# Patient Record
Sex: Female | Born: 1960 | Race: White | Hispanic: No | Marital: Married | Smoking: Never smoker
Health system: Southern US, Community
[De-identification: ages and names within clinical notes are randomized; demographics above are authoritative.]

## PROBLEM LIST (undated history)

## (undated) DIAGNOSIS — H9319 Tinnitus, unspecified ear: Secondary | ICD-10-CM

## (undated) DIAGNOSIS — E039 Hypothyroidism, unspecified: Secondary | ICD-10-CM

## (undated) DIAGNOSIS — I639 Cerebral infarction, unspecified: Secondary | ICD-10-CM

## (undated) DIAGNOSIS — R42 Dizziness and giddiness: Secondary | ICD-10-CM

## (undated) HISTORY — DX: Tinnitus, unspecified ear: H93.19

## (undated) HISTORY — DX: Cerebral infarction, unspecified: I63.9

## (undated) HISTORY — PX: LUMBAR FUSION: SHX111

## (undated) HISTORY — DX: Hypothyroidism, unspecified: E03.9

## (undated) HISTORY — DX: Dizziness and giddiness: R42

---

## 2005-02-12 HISTORY — PX: LASIK: SHX215

## 2014-11-29 DIAGNOSIS — I639 Cerebral infarction, unspecified: Secondary | ICD-10-CM

## 2014-11-29 HISTORY — DX: Cerebral infarction, unspecified: I63.9

## 2014-12-09 ENCOUNTER — Ambulatory Visit (INDEPENDENT_AMBULATORY_CARE_PROVIDER_SITE_OTHER): Payer: BLUE CROSS/BLUE SHIELD | Admitting: Diagnostic Neuroimaging

## 2014-12-09 ENCOUNTER — Encounter: Payer: Self-pay | Admitting: *Deleted

## 2014-12-09 VITALS — BP 128/85 | HR 57 | Ht 62.0 in | Wt 164.2 lb

## 2014-12-09 DIAGNOSIS — I639 Cerebral infarction, unspecified: Secondary | ICD-10-CM

## 2014-12-09 DIAGNOSIS — I6381 Other cerebral infarction due to occlusion or stenosis of small artery: Secondary | ICD-10-CM

## 2014-12-09 NOTE — Progress Notes (Signed)
[facility]  PATIENT: Janice Warren DOB: 1960/04/25  REFERRING CLINICIAN: Vyas  HISTORY FROM: patient and husband  REASON FOR VISIT: new consult     HISTORICAL  CHIEF COMPLAINT:  No chief complaint on file.   HISTORY OF PRESENT ILLNESS:   54 year old right-handed female with hypothyroidism, here for evaluation of stroke.  11/29/14 patient was under increased stress, moving boxes and furniture, when all of a sudden she had dizziness, difficulty standing, right arm and right leg heaviness, mild word finding difficulties. This occurred around 10:00 PM. Patient was evaluated by her son that evening and patient and son decided not to go to the emergency room that evening.  The next day patient's symptoms were persistent. She went to her PCP for evaluation of vertigo but right sided weakness symptoms were not explained. One week later patient had follow-up and at that time right-sided weakness was noted. Patient was then scheduled for MRI scan which was completed on 12/07/14. Acute to subacute stroke was noted in the left posterior frontal centrum semiovale region. Patient also had carotid ultrasound testing showed no significant stenosis of bilateral ICAs.  Since that time symptoms have slightly improved. She still has some weakness on her right side, mainly in her right leg. She denies any weakness in her right face. Her language problems have resolved. She denies any numbness in her right side.   REVIEW OF SYSTEMS: Full 14 system review of systems performed and notable only for headache weakness dizziness joint pain allergies decreased energy.  ALLERGIES: No Known Allergies  HOME MEDICATIONS: No outpatient prescriptions prior to visit.   No facility-administered medications prior to visit.    PAST MEDICAL HISTORY: Past Medical History  Diagnosis Date  . Hypothyroid     x 18 years  . Stroke [facility]) 11/29/14    PAST SURGICAL HISTORY: Past Surgical  History  Procedure Laterality Date  . Cesarean section  1994    FAMILY HISTORY: Family History  Problem Relation Age of Onset  . Osteoporosis Mother   . Dementia Father   . Stroke Father   . Heart attack Father   . Hypertension Sister   . Coronary artery disease Sister     SOCIAL HISTORY:  Social History   Social History  . Marital Status: Married    Spouse Name: Loraine Leriche  . Number of Children: 3  . 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 Smoking status: Never Smoker   . Smokeless tobacco: Not on file  . Alcohol Use: No  . Drug Use: No  . Sexual Activity: Not on file   Other Topics Concern  . Not on file   Social History Narrative   Lives at home with husband   Caffeine use- none     PHYSICAL EXAM  GENERAL EXAM/CONSTITUTIONAL: Vitals:  Filed Vitals:   12/09/14 1422  BP: 128/85  Pulse: 57  Height:  (1.575 m)  Weight: 164 lb 3.2 oz (74.481 kg)     Body mass index is 30.03 kg/(m^2).  Visual Acuity Screening   Right eye Left eye Both eyes  Without correction:     With correction: 20/30 20/40      Patient is in no distress; well developed, nourished and groomed; neck is supple  CARDIOVASCULAR:  Examination of carotid arteries is normal; no carotid bruits  Regular rate and rhythm, no murmurs  Examination of peripheral  vascular system by observation and palpation is normal  EYES:  Ophthalmoscopic exam of optic discs and posterior segments is normal; no papilledema or hemorrhages  MUSCULOSKELETAL:  Gait, strength, tone, movements noted in Neurologic exam below  NEUROLOGIC: MENTAL STATUS:  No flowsheet data found.  awake, alert, oriented to person, place and time  recent and remote memory intact  normal attention and concentration  language fluent, comprehension intact, naming intact,   fund of knowledge appropriate  CRANIAL NERVE:   2nd - no papilledema on fundoscopic  exam  2nd, 3rd, 4th, 6th - pupils equal and reactive to light, visual fields full to confrontation, extraocular muscles intact, no nystagmus  5th - facial sensation symmetric  7th - facial strength symmetric  8th - hearing intact  9th - palate elevates symmetrically, uvula midline  11th - shoulder shrug symmetric  12th - tongue protrusion midline  MOTOR:   normal bulk and tone, full strength in the LUE, LLE; RIGHT TRICEP 4+, RIGHT HIP FLEX 3+; OTHERWISE RLE 5  SLIGHTLY DECR FINGER AND FOOT TAPPING ON RIGHT SIDE   SENSORY:   normal and symmetric to light touch, temperature, vibration  COORDINATION:   finger-nose-finger, fine finger movements SLIGHTLY SLOWER ON RIGHT SIDE  REFLEXES:   deep tendon reflexes present and symmetric  GAIT/STATION:   narrow based gait; SLIGHT LIMP ON RIGHT LEG    DIAGNOSTIC DATA (LABS, IMAGING, TESTING) - I reviewed patient records, labs, notes, testing and imaging myself where available.  No results found for: WBC, HGB, HCT, MCV, PLT No results found for: NA, K, CL, CO2, GLUCOSE, BUN, CREATININE, CALCIUM, PROT, ALBUMIN, AST, ALT, ALKPHOS, BILITOT, GFRNONAA, GFRAA No results found for: CHOL, HDL, LDLCALC, LDLDIRECT, TRIG, CHOLHDL No results found for: XBMW4XHGBA1C No results found for: VITAMINB12 No results found for: TSH   12/07/14 MRI BRAIN [I reviewed images myself and agree with interpretation. ADC map signal is iso-intense, with T2 and T2FLAIR hyperintensity, consistent with -VRP]  - Acute LEFT posterior frontoparietal subcortical white matter infarct. No associated hemorrhage.  12/08/14 Carotid u/s  - Color duplex indicates minimal heterogeneous plaque, with no hemodynamically significant stenosis by duplex criteria in the extracranial cerebrovascular circulation.      ASSESSMENT AND PLAN  54 y.o. 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  Patient was not on antiplatelet therapy at the time of stroke. Now patient is on Plavix 75 mg daily. Interestingly patient does not seem to have any obvious stroke risk factors at this time. Last PCP if diabetes and lipid panel were checked recently. Patient also needs echocardiogram of the heart. Due to patient's home location and distance from [place], patient and husband prefer that these testing be arranged by PCP local to her home.   Dx:  Left sided lacunar stroke [facility](HCC)  PLAN: - continue plavix 75mg  daily - ask Dr. Sherril CroonVyas to setup ultrasound of heart (echocardiogram), sleep study, physical/occupational therapy, cholesterol panel testing, diabetes testing; patient prefers to have testing done locally near [place], and requests that these be arranged by Dr. Sherril CroonVyas. - stroke warning signs and treatment reviewed  Return in about 3 months (around 03/11/2015).    Suanne MarkerVIKRAM R. Soleia Badolato, MD 12/09/2014, 3:12 PM Certified in Neurology, Neurophysiology and Neuroimaging  [facility] [address], [address] [place], [place] [address] [phone]

## 2014-12-09 NOTE — Patient Instructions (Signed)
Thank you for coming to see [place] at [facility]. I hope we have been able to provide you high quality care today.  You may receive a patient satisfaction survey over the next few weeks. We would appreciate your feedback and comments so that we may continue to improve ourselves and the health of our patients.  - continue plavix 75m daily - ask Dr. VWoody Sellerto setup ultrasound of heart (echocardiogram), sleep study, physical/occupational therapy, cholesterol panel testing, diabetes testing   ~~~~~~~~~~~~~~~~~~~~~~~~~~~~~~~~~~~~~~~~~~~~~~~~~~~~~~~~~~~~~~~~~  DR. Leeanne Butters'S GUIDE TO HAPPY AND HEALTHY LIVING These are some of my general health and wellness recommendations. Some of them may apply to you better than others. Please use common sense as you try these suggestions and feel free to ask me any questions.   ACTIVITY/FITNESS Mental, social, emotional and physical stimulation are very important for brain and body health. Try learning a new activity (arts, music, language, sports, games).  Keep moving your body to the best of your abilities. You can do this at home, inside or outside, the park, community center, gym or anywhere you like. Consider a physical therapist or personal trainer to get started. Consider the app Sworkit. Fitness trackers such as smart-watches, smart-phones or Fitbits can help as well.   NUTRITION Eat more plants: colorful vegetables, nuts, seeds and berries.  Eat less sugar, salt, preservatives and processed foods.  Avoid toxins such as cigarettes and alcohol.  Drink water when you are thirsty. Warm water with a slice of lemon is an excellent morning drink to start the day.  Consider these websites for more information The Nutrition Source (hhttps://www.henry-hernandez.biz/ Precision Nutrition (wWindowBlog.ch   RELAXATION Consider practicing mindfulness meditation or other relaxation techniques such as  deep breathing, prayer, yoga, tai chi, massage. See website mindful.org or the apps Headspace or Calm to help get started.   SLEEP Try to get at least 7-8+ hours sleep per day. Regular exercise and reduced caffeine will help you sleep better. Practice good sleep hygeine techniques. See website sleep.org for more information.   PLANNING Prepare estate planning, living will, healthcare POA documents. Sometimes this is best planned with the help of an attorney. Theconversationproject.org and agingwithdignity.org are excellent resources.

## 2014-12-13 ENCOUNTER — Telehealth: Payer: Self-pay | Admitting: Diagnostic Neuroimaging

## 2014-12-13 NOTE — Telephone Encounter (Signed)
Spoke with Elon JesterMichele, Dr Sherril CroonVyas office and informed her of patient wanting tests, services scheduled through Dr Sherril CroonVyas office due to where she lives. Elon JesterMichele stated she does not have information which this office sent on 12/09/14. Faxed Dr Richrd HumblesPenumalli's OV note from 12/09/14 to Michele/Dr Vyas.  1:57 pm  Spoke with patient and informed her that information was faxed attention Elon JesterMichele, Dr Sherril CroonVyas office. She verbalized understanding, appreciation for call.

## 2014-12-13 NOTE — Telephone Encounter (Signed)
Pt called and states that she called her PCP and they have not been notified of setting up testing for her per office visit with Dr. Marjory LiesPenumalli. She would like a call to be made to Dr. Bonnita LevanVyas's office as soon as possible so appts can be made. Please call and advise [phone]

## 2015-01-19 ENCOUNTER — Telehealth: Payer: Self-pay | Admitting: Diagnostic Neuroimaging

## 2015-01-19 NOTE — Telephone Encounter (Signed)
Pt called and needs a letter of proof of neurological damage. She requested imaging copies and was told how to get them. She also requested office notes. I told her she would need to go through medical records. She expressed understanding. Please call and advise about letter [phone]

## 2015-01-19 NOTE — Telephone Encounter (Signed)
Spoke with patient who states she has secondary insurance that is requiring documents, imaging, etc. Informed her that Dr Visteon CorporationPenumalli's office visit note, MRI reports ang imaging should be sufficient documentation for her; a letter would most likely just be repetition of office note. She sated that she understood, agreed. Advised she gather all required information and if insurance needs additional, they will contact her. At that point she can call this office back. She verbalized understanding, appreciation for call.

## 2015-02-01 ENCOUNTER — Telehealth: Payer: Self-pay | Admitting: Diagnostic Neuroimaging

## 2015-02-01 NOTE — Telephone Encounter (Signed)
LVM informing patient of Dr Richrd HumblesPenumalli's reply. Advised that if she wants to see him before 03/15/15 follow up, the phone staff can make that appointment for her. Left this caller's name, number.

## 2015-02-01 NOTE — Telephone Encounter (Signed)
Patient called regarding headaches this month, patient has history of stroke, diagnosed with mild sleep apnea approximately 2 weeks ago, exhausted, gets dizzy every now and then, sensitive to light sometimes, doesn't feel that these are stroke symptoms.

## 2015-02-01 NOTE — Telephone Encounter (Signed)
If headaches are new, we can check another mri brain. Or she can come in for evaluation here, PCP or ER. -VRP

## 2015-02-01 NOTE — Telephone Encounter (Signed)
Spoke with patient who states she has been waking up every day x 2-3 weeks with headache "across her forehead". She takes regular Tylenol prn with relief, but she states "HA comes back when medication wears off". She states she is waiting for CPAP, still fatigued, has occasional dizziness but no falls, denies blurred vision, numbness, tingling, speech difficulties or other stroke like symptoms. She states "my thyroid level is still not right either". She states she only drinks water and tries to drink 8 glasses/day to stay hydrated. She states her husband watches TV and has the volume loud, which she states bothers her but this has been ongoing. She states the light sensitivity and dizziness do not necessarily occur during her headaches. Advised her that her symptoms are most likely related to sleep apnea and thyroid issues, however will route her concerns to Dr Marjory LiesPenumalli for his response. Informed her would try to call her back by 5 pm today, but no later than 5 pm tomorrow.  She verbalized understanding, appreciation.

## 2015-03-15 ENCOUNTER — Encounter: Payer: Self-pay | Admitting: Diagnostic Neuroimaging

## 2015-03-15 ENCOUNTER — Ambulatory Visit (INDEPENDENT_AMBULATORY_CARE_PROVIDER_SITE_OTHER): Payer: BLUE CROSS/BLUE SHIELD | Admitting: Diagnostic Neuroimaging

## 2015-03-15 VITALS — BP 121/80 | HR 64 | Ht 62.0 in | Wt 173.2 lb

## 2015-03-15 DIAGNOSIS — I6381 Other cerebral infarction due to occlusion or stenosis of small artery: Secondary | ICD-10-CM

## 2015-03-15 DIAGNOSIS — I639 Cerebral infarction, unspecified: Secondary | ICD-10-CM

## 2015-03-15 NOTE — Progress Notes (Signed)
[facility]  PATIENT: Janice Warren DOB: 1960/04/08  REFERRING CLINICIAN: Vyas  HISTORY FROM: patient and sister  REASON FOR VISIT: follow up    HISTORICAL  CHIEF COMPLAINT:  Chief Complaint  Patient presents with  . Left sided lacunar stroke    rm 7, sister Victorino Dike, Sleep apnea- CPAP ordered, doing PT exercises at home, regainging strength but not as active as I used to be"  . Follow-up    3 month    HISTORY OF PRESENT ILLNESS:   UPDATE 03/15/15: Since last visit, doing well. Testing completed. Echo, labs unremarkable. Sleep apnea found on PSG, now planning to have CPAP treatment. Pt sister here today, and now reports strong family hx of afib. Headaches from 1 month ago are resolved with increaed water intake.  PRIOR HPI (12/09/14): 55 year old right-handed female with hypothyroidism, here for evaluation of stroke. 11/29/14 patient was under increased stress, moving boxes and furniture, when all of a sudden she had dizziness, difficulty standing, right arm and right leg heaviness, mild word finding difficulties. This occurred around 10:00 PM. Patient was evaluated by her son that evening and patient and son decided not to go to the emergency room that evening. The next day patient's symptoms were persistent. She went to her PCP for evaluation of vertigo but right sided weakness symptoms were not explained. One week later patient had follow-up and at that time right-sided weakness was noted. Patient was then scheduled for MRI scan which was completed on 12/07/14. Acute to subacute stroke was noted in the left posterior frontal centrum semiovale region. Patient also had carotid ultrasound testing showed no significant stenosis of bilateral ICAs. Since that time symptoms have slightly improved. She still has some weakness on her right side, mainly in her right leg. She denies any weakness in her right face. Her language problems have resolved. She denies any  numbness in her right side.   REVIEW OF SYSTEMS: Full 14 system review of systems performed and notable only for decr concentration light sens weakness.  ALLERGIES: No Known Allergies  HOME MEDICATIONS: Outpatient Prescriptions Prior to Visit  Medication Sig Dispense Refill  . acetaminophen (TYLENOL) 325 MG tablet Take 650 mg by mouth every 6 (six) hours as needed.    . clopidogrel (PLAVIX) 75 MG tablet 75 mg daily.    Marland Kitchen escitalopram (LEXAPRO) 10 MG tablet 10 mg daily.    Marland Kitchen levothyroxine (SYNTHROID, LEVOTHROID) 88 MCG tablet Take 88 mcg by mouth daily before breakfast.    . meclizine (ANTIVERT) 25 MG tablet 25 mg.  0  . Vitamin D, Ergocalciferol, (DRISDOL) 50000 UNITS CAPS capsule Take 50,000 Units by mouth once a week.  5  . zolpidem (AMBIEN) 10 MG tablet Take 10 mg by mouth at bedtime as needed for sleep. USUALLY TAKES 1/2 TAB    . diclofenac (VOLTAREN) 75 MG EC tablet 75 mg.  2   No facility-administered medications prior to visit.    PAST MEDICAL HISTORY: Past Medical History  Diagnosis Date  . Hypothyroid     x 18 years  . Stroke [facility]) 11/29/14    PAST SURGICAL HISTORY: Past Surgical History  Procedure Laterality Date  . Cesarean section  1994    FAMILY HISTORY: Family History  Problem Relation Age of Onset  . Osteoporosis Mother   . Dementia Father   . Stroke Father   . Heart attack Father   . Hypertension Sister   . Coronary artery disease Sister  SOCIAL HISTORY:  Social History   Social History  . Marital Status: Married    Spouse Name: Loraine Leriche  . Number of Children: 3  . 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 Smoking status: Never Smoker   . Smokeless tobacco: Not on file  . Alcohol Use: No  . Drug Use: No  . Sexual Activity: Not on file   Other Topics Concern  . Not on file   Social History Narrative   Lives at home with husband   Caffeine use- none     PHYSICAL  EXAM  GENERAL EXAM/CONSTITUTIONAL: Vitals:  Filed Vitals:   03/15/15 1037  BP: 121/80  Pulse: 64  Height:  (1.575 m)  Weight: 173 lb 3.2 oz (78.563 kg)   Body mass index is 31.67 kg/(m^2). No exam data present  Patient is in no distress; well developed, nourished and groomed; neck is supple  CARDIOVASCULAR:  Examination of carotid arteries is normal; no carotid bruits  Regular rate and rhythm, no murmurs  Examination of peripheral vascular system by observation and palpation is normal  EYES:  Ophthalmoscopic exam of optic discs and posterior segments is normal; no papilledema or hemorrhages  MUSCULOSKELETAL:  Gait, strength, tone, movements noted in Neurologic exam below  NEUROLOGIC: MENTAL STATUS:  No flowsheet data found.  awake, alert, oriented to person, place and time  recent and remote memory intact  normal attention and concentration  language fluent, comprehension intact, naming intact,   fund of knowledge appropriate  CRANIAL NERVE:   2nd, 3rd, 4th, 6th - pupils equal and reactive to light, visual fields full to confrontation, extraocular muscles intact, no nystagmus  5th - facial sensation symmetric  7th - facial strength symmetric  8th - hearing intact  9th - palate elevates symmetrically, uvula midline  11th - shoulder shrug symmetric  12th - tongue protrusion midline  MOTOR:   normal bulk and tone, full strength in the BUE and BLE  SENSORY:   normal and symmetric to light touch, temperature, vibration  COORDINATION:   finger-nose-finger, fine finger movements normal  REFLEXES:   deep tendon reflexes present and symmetric  GAIT/STATION:   narrow based gait; able to tandem    DIAGNOSTIC DATA (LABS, IMAGING, TESTING) - I reviewed patient records, labs, notes, testing and imaging myself where available.  No results found for: WBC, HGB, HCT, MCV, PLT No results found for: NA, K, CL, CO2, GLUCOSE, BUN, CREATININE,  CALCIUM, PROT, ALBUMIN, AST, ALT, ALKPHOS, BILITOT, GFRNONAA, GFRAA No results found for: CHOL, HDL, LDLCALC, LDLDIRECT, TRIG, CHOLHDL No results found for: [id number] No results found for: VITAMINB12 No results found for: TSH   12/07/14 MRI BRAIN [I reviewed images myself and agree with interpretation. ADC map signal is iso-intense, with T2 and T2FLAIR hyperintensity, consistent with -VRP]  - Acute LEFT posterior frontoparietal subcortical white matter infarct. No associated hemorrhage.  12/08/14 Carotid u/s  - Color duplex indicates minimal heterogeneous plaque, with no hemodynamically significant stenosis by duplex criteria in the extracranial cerebrovascular circulation.      ASSESSMENT AND PLAN  54 y.o. 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 Patient was not on antiplatelet therapy at the time of stroke. Now patient is on Plavix 75 mg daily. Has had good recovery. Stroke risk factors at this time include sleep apnea.   Dx:  Left sided lacunar stroke (HCC)   PLAN: - continue plavix  daily - continue sleep apnea treatment plan - stroke warning signs and treatment reviewed - refer to cardiology (Dr. Catha Gosselin) for outpatient cardiac monitoring due to strong afib family history  Return if symptoms worsen or fail to improve, for return to PCP.    Suanne Marker, MD 03/15/2015, 11:06 AM Certified in Neurology, Neurophysiology and Neuroimaging  [facility] [address] [place], [place] [address] [phone]

## 2015-03-15 NOTE — Patient Instructions (Signed)
Thank you for coming to see [place] at [facility]. I hope we have been able to provide you high quality care today.  You may receive a patient satisfaction survey over the next few weeks. We would appreciate your feedback and comments so that we may continue to improve ourselves and the health of our patients.  - follow up with PCP (Dr. Woody Seller)  - I will refer you to cardiology (Dr. Luiz Ochoa) for heart monitor testing   ~~~~~~~~~~~~~~~~~~~~~~~~~~~~~~~~~~~~~~~~~~~~~~~~~~~~~~~~~~~~~~~~~  DR. Kashmir Leedy'S GUIDE TO HAPPY AND HEALTHY LIVING These are some of my general health and wellness recommendations. Some of them may apply to you better than others. Please use common sense as you try these suggestions and feel free to ask me any questions.   ACTIVITY/FITNESS Mental, social, emotional and physical stimulation are very important for brain and body health. Try learning a new activity (arts, music, language, sports, games).  Keep moving your body to the best of your abilities. You can do this at home, inside or outside, the park, community center, gym or anywhere you like. Consider a physical therapist or personal trainer to get started. Consider the app Sworkit. Fitness trackers such as smart-watches, smart-phones or Fitbits can help as well.   NUTRITION Eat more plants: colorful vegetables, nuts, seeds and berries.  Eat less sugar, salt, preservatives and processed foods.  Avoid toxins such as cigarettes and alcohol.  Drink water when you are thirsty. Warm water with a slice of lemon is an excellent morning drink to start the day.  Consider these websites for more information The Nutrition Source (https://www.henry-hernandez.biz/) Precision Nutrition (WindowBlog.ch)   RELAXATION Consider practicing mindfulness meditation or other relaxation techniques such as deep breathing, prayer, yoga, tai chi, massage. See website mindful.org  or the apps Headspace or Calm to help get started.   SLEEP Try to get at least 7-8+ hours sleep per day. Regular exercise and reduced caffeine will help you sleep better. Practice good sleep hygeine techniques. See website sleep.org for more information.   PLANNING Prepare estate planning, living will, healthcare POA documents. Sometimes this is best planned with the help of an attorney. Theconversationproject.org and agingwithdignity.org are excellent resources.

## 2018-12-03 ENCOUNTER — Other Ambulatory Visit: Payer: Self-pay

## 2018-12-03 ENCOUNTER — Encounter (HOSPITAL_COMMUNITY): Payer: Self-pay | Admitting: *Deleted

## 2018-12-03 ENCOUNTER — Emergency Department (HOSPITAL_COMMUNITY)
Admission: EM | Admit: 2018-12-03 | Discharge: 2018-12-03 | Disposition: A | Discharge status: Home / Self Care | Payer: BC Managed Care – PPO | Attending: Emergency Medicine | Admitting: Emergency Medicine

## 2018-12-03 ENCOUNTER — Emergency Department (HOSPITAL_COMMUNITY): Payer: BC Managed Care – PPO

## 2018-12-03 DIAGNOSIS — Z79899 Other long term (current) drug therapy: Secondary | ICD-10-CM | POA: Diagnosis not present

## 2018-12-03 DIAGNOSIS — R0602 Shortness of breath: Secondary | ICD-10-CM

## 2018-12-03 DIAGNOSIS — J9801 Acute bronchospasm: Secondary | ICD-10-CM

## 2018-12-03 DIAGNOSIS — R0981 Nasal congestion: Secondary | ICD-10-CM | POA: Diagnosis present

## 2018-12-03 DIAGNOSIS — Z7902 Long term (current) use of antithrombotics/antiplatelets: Secondary | ICD-10-CM | POA: Insufficient documentation

## 2018-12-03 DIAGNOSIS — E039 Hypothyroidism, unspecified: Secondary | ICD-10-CM | POA: Insufficient documentation

## 2018-12-03 MED ORDER — ALBUTEROL SULFATE HFA 108 (90 BASE) MCG/ACT IN AERS
2.0000 | INHALATION_SPRAY | Freq: Once | RESPIRATORY_TRACT | Status: AC
  Administered 2018-12-03: 2 via RESPIRATORY_TRACT
  Filled 2018-12-03: qty 6.7

## 2018-12-03 MED ORDER — PREDNISONE 50 MG PO TABS
60.0000 mg | ORAL_TABLET | Freq: Once | ORAL | Status: AC
  Administered 2018-12-03: 60 mg via ORAL
  Filled 2018-12-03: qty 1

## 2018-12-03 MED ORDER — PREDNISONE 20 MG PO TABS: ORAL_TABLET | ORAL | 0 refills | Status: DC

## 2018-12-03 NOTE — ED Provider Notes (Signed)
[facility] EMERGENCY DEPARTMENT Provider Note   CSN: [id number] Arrival date & time: 12/03/18  1414     History   Chief Complaint No chief complaint on file.   HPI Janice Warren is a 58 y.o. female.     HPI Patient states she has had sinus congestion and ear infection for the past month.  She has had 2 rounds of antibiotics including Augmentin and doxycycline.  She occasionally has vertigo for which she takes Antivert.  She presents today for sensation she is " exhaling through cotton".  States she has a dry nonproductive cough.  She denies any chest pain.  Denies any fever or chills.  No new lower extremity swelling or pain.  Was seen by her primary physician referred to the emergency department for a chest x-ray to rule out pneumonia. Past Medical History:  Diagnosis Date  . Hypothyroid    x 18 years  . Stroke ([place]) 11/29/14    There are no active problems to display for this patient.   Past Surgical History:  Procedure Laterality Date  . CESAREAN SECTION  1994     OB History   No obstetric history on file.      Home Medications    Prior to Admission medications   Medication Sig Start Date End Date Taking? Authorizing Provider  acetaminophen (TYLENOL) 325 MG tablet Take 650 mg by mouth every 6 (six) hours as needed.    [provider]  clopidogrel (PLAVIX) 75 MG tablet 75 mg daily. 12/08/14   [provider]  escitalopram (LEXAPRO) 10 MG tablet 10 mg daily. 09/30/14   [provider]  levothyroxine (SYNTHROID, LEVOTHROID) 88 MCG tablet Take 88 mcg by mouth daily before breakfast.    [provider]  meclizine (ANTIVERT) 25 MG tablet 25 mg. 11/30/14   [provider]  predniSONE (DELTASONE) 20 MG tablet 3 tabs po day one, then 2 po daily x 4 days 12/04/18   Julianne Rice, MD  Vitamin D, Ergocalciferol, (DRISDOL) 50000 UNITS CAPS capsule Take 50,000 Units by mouth once a week. 11/29/14   [provider]   zolpidem (AMBIEN) 10 MG tablet Take 10 mg by mouth at bedtime as needed for sleep. USUALLY TAKES 1/2 TAB    [provider]    Family History Family History  Problem Relation Age of Onset  . Osteoporosis Mother   . Dementia Father   . Stroke Father   . Heart attack Father   . Hypertension Sister   . Coronary artery disease Sister     Social History Social History   Tobacco Use  . Smoking status: Never Smoker  . Smokeless tobacco: Never Used  Substance Use Topics  . Alcohol use: No    Alcohol/week: 0.0 standard drinks  . Drug use: No     Allergies   Patient has no known allergies.   Review of Systems Review of Systems  Constitutional: Negative for chills and fever.  HENT: Positive for congestion and rhinorrhea. Negative for sinus pressure and sore throat.   Eyes: Negative for pain, redness and visual disturbance.  Respiratory: Positive for cough.   Cardiovascular: Negative for chest pain, palpitations and leg swelling.  Gastrointestinal: Negative for abdominal pain, constipation, diarrhea, nausea and vomiting.  Genitourinary: Negative for dysuria, flank pain and frequency.  Musculoskeletal: Negative for back pain, myalgias, neck pain and neck stiffness.  Skin: Negative for rash and wound.  Neurological: Positive for dizziness. Negative for syncope, weakness, light-headedness, numbness  and headaches.  All other systems reviewed and are negative.    Physical Exam Updated Vital Signs BP 124/87   Pulse (!) 58   Temp 98.1 F (36.7 C) (Oral)   Resp 20   Ht 5\' 2"  (1.575 m)   Wt 83.9 kg   SpO2 100%   BMI 33.84 kg/m   Physical Exam Vitals signs and nursing note reviewed.  Constitutional:      Appearance: Normal appearance. She is well-developed.  HENT:     Head: Normocephalic and atraumatic.     Nose: Rhinorrhea present.     Mouth/Throat:     Mouth: Mucous membranes are moist.     Pharynx: No oropharyngeal exudate or posterior oropharyngeal  erythema.  Eyes:     Extraocular Movements: Extraocular movements intact.     Pupils: Pupils are equal, round, and reactive to light.  Neck:     Musculoskeletal: Normal range of motion and neck supple. No neck rigidity or muscular tenderness.  Cardiovascular:     Rate and Rhythm: Normal rate and regular rhythm.     Heart sounds: No murmur. No friction rub. No gallop.   Pulmonary:     Effort: Pulmonary effort is normal.     Comments: Patient with prolonged and diminished expiratory phase.  No respiratory distress. Abdominal:     General: Bowel sounds are normal.     Palpations: Abdomen is soft.     Tenderness: There is no abdominal tenderness. There is no guarding or rebound.  Musculoskeletal: Normal range of motion.        General: No swelling, tenderness, deformity or signs of injury.     Right lower leg: No edema.  Lymphadenopathy:     Cervical: No cervical adenopathy.  Skin:    General: Skin is warm and dry.     Capillary Refill: Capillary refill takes less than 2 seconds.     Findings: No erythema or rash.  Neurological:     General: No focal deficit present.     Mental Status: She is alert and oriented to person, place, and time.     Comments: Patient is alert and oriented x3 with clear, goal oriented speech. Patient has 5/5 motor in all extremities. Sensation is intact to light touch. Bilateral finger-to-nose is normal with no signs of dysmetria. Patient has a normal gait and walks without assistance.  Psychiatric:        Behavior: Behavior normal.      ED Treatments / Results  Labs (all labs ordered are listed, but only abnormal results are displayed) Labs Reviewed - No data to display  EKG None  Radiology Dg Chest [facility] 1 View  Result Date: 12/03/2018 CLINICAL DATA:  Shortness of breath, congestion. EXAM: PORTABLE CHEST 1 VIEW COMPARISON:  Chest x-ray from 02/07/2006 FINDINGS: It has IMPRESSION: No active disease. Electronically Signed   By: 02/09/2006 M.D.    On: 12/03/2018 17:23    Procedures Procedures (including critical care time)  Medications Ordered in ED Medications  predniSONE (DELTASONE) tablet 60 mg (60 mg Oral Given 12/03/18 1747)  albuterol (VENTOLIN HFA) 108 (90 Base) MCG/ACT inhaler 2 puff (2 puffs Inhalation Given 12/03/18 1749)     Initial Impression / Assessment and Plan / ED Course  I have reviewed the triage vital signs and the nursing notes.  Pertinent labs & imaging results that were available during my care of the patient were reviewed by me and considered in my medical decision making (see chart for details).  Suspect patient has bronchospasm likely related to URI or allergies.  Will get chest x-ray.  Assuming no pneumonia we will start on short course of prednisone and give inhaler. X-ray without acute findings.  Advised to follow-up closely with primary physician and return precautions given. Final Clinical Impressions(s) / ED Diagnoses   Final diagnoses:  Bronchospasm    ED Discharge Orders         Ordered    predniSONE (DELTASONE) 20 MG tablet     12/03/18 1749           Loren RacerYelverton, Arijana Narayan, MD 12/03/18 1750

## 2018-12-03 NOTE — ED Triage Notes (Signed)
Pt c/o congestion, face pain and pressure, occasional right ear pain, runny nose (clear to Janice Warren), dizziness with movement, tightness in lungs x 1 month. Pt reports it feels like she's breathing through cotton and can't fully exhale. Denies fever. Pt was placed on antibiotics on 10/30/18 with some relief but never went away. Pt had a video appt with her PCP today and they recommended she come to the ED for chest xray and further evaluation.

## 2019-11-09 ENCOUNTER — Encounter: Payer: Self-pay | Admitting: *Deleted

## 2019-11-10 ENCOUNTER — Encounter: Payer: Self-pay | Admitting: *Deleted

## 2019-11-11 ENCOUNTER — Other Ambulatory Visit: Payer: Self-pay

## 2019-11-11 ENCOUNTER — Encounter: Payer: Self-pay | Admitting: Diagnostic Neuroimaging

## 2019-11-11 ENCOUNTER — Ambulatory Visit (INDEPENDENT_AMBULATORY_CARE_PROVIDER_SITE_OTHER): Payer: BC Managed Care – PPO | Admitting: Diagnostic Neuroimaging

## 2019-11-11 VITALS — BP 130/87 | HR 77 | Wt 189.0 lb

## 2019-11-11 DIAGNOSIS — R42 Dizziness and giddiness: Secondary | ICD-10-CM

## 2019-11-11 DIAGNOSIS — G4489 Other headache syndrome: Secondary | ICD-10-CM

## 2019-11-11 NOTE — Patient Instructions (Signed)
-   check MRI brain 

## 2019-11-11 NOTE — Progress Notes (Signed)
[facility]  PATIENT: Janice Warren DOB: 1960/05/10  REFERRING CLINICIAN: Vyas  HISTORY FROM: patient REASON FOR VISIT: new consult     HISTORICAL  CHIEF COMPLAINT:  Chief Complaint  Patient presents with  . Dizziness    rm 6 New Patient  "episode of lightening like feeling from back of my head and sudden dizziness, has improved with Meclizine"    HISTORY OF PRESENT ILLNESS:   UPDATE (11/11/19, VRP): Since last visit, doing well, until end of July 2021: had sudden lightning sensation in left parietal lobe with transient vertigo (few seconds). Then had intermittent tinnitus in right ear. Symptoms are now resolved. No alleviating or aggravating factors.   UPDATE 03/15/15: Since last visit, doing well. Testing completed. Echo, labs unremarkable. Sleep apnea found on PSG, now planning to have CPAP treatment. Pt sister here today, and now reports strong family hx of afib. Headaches from 1 month ago are resolved with increaed water intake.  PRIOR HPI (12/09/14): 59 year old right-handed female with hypothyroidism, here for evaluation of stroke. 11/29/14 patient was under increased stress, moving boxes and furniture, when all of a sudden she had dizziness, difficulty standing, right arm and right leg heaviness, mild word finding difficulties. This occurred around 10:00 PM. Patient was evaluated by her son that evening and patient and son decided not to go to the emergency room that evening. The next day patient's symptoms were persistent. She went to her PCP for evaluation of vertigo but right sided weakness symptoms were not explained. One week later patient had follow-up and at that time right-sided weakness was noted. Patient was then scheduled for MRI scan which was completed on 12/07/14. Acute to subacute stroke was noted in the left posterior frontal centrum semiovale region. Patient also had carotid ultrasound testing showed no significant stenosis of bilateral  ICAs. Since that time symptoms have slightly improved. She still has some weakness on her right side, mainly in her right leg. She denies any weakness in her right face. Her language problems have resolved. She denies any numbness in her right side.   REVIEW OF SYSTEMS: Full 14 system review of systems performed and negative except: as per HPI.   ALLERGIES: Allergies  Allergen Reactions  . Ambien [Zolpidem] Other (See Comments)    Anxiety,palpitations    HOME MEDICATIONS: Outpatient Medications Prior to Visit  Medication Sig Dispense Refill  . acetaminophen (TYLENOL) 325 MG tablet Take 650 mg by mouth every 6 (six) hours as needed.    . clopidogrel (PLAVIX) 75 MG tablet 75 mg daily.    Marland Kitchen doxepin (SINEQUAN) 10 MG capsule Take 10-20 mg by mouth at bedtime as needed.    Marland Kitchen escitalopram (LEXAPRO) 10 MG tablet 10 mg daily.    Marland Kitchen levothyroxine (SYNTHROID, LEVOTHROID) 88 MCG tablet Take 88 mcg by mouth daily before breakfast.    . meclizine (ANTIVERT) 25 MG tablet 25 mg.  0  . metoprolol tartrate (LOPRESSOR) 25 MG tablet Take 25 mg by mouth as needed.    . Multiple Vitamin (MULTIVITAMIN ADULT PO) Take by mouth.    . simvastatin (ZOCOR) 20 MG tablet Take 20 mg by mouth daily.    . Triamcinolone Acetonide (NASACORT ALLERGY 24HR NA) Place into the nose as needed.    . predniSONE (DELTASONE) 20 MG tablet 3 tabs po day one, then 2 po daily x 4 days 11 tablet 0  . Vitamin D, Ergocalciferol, (DRISDOL) 50000 UNITS CAPS capsule Take 50,000 Units by mouth once a week.  5  . zolpidem (AMBIEN) 10 MG tablet Take 10 mg by mouth at bedtime as needed for sleep. USUALLY TAKES 1/2 TAB     No facility-administered medications prior to visit.    PAST MEDICAL HISTORY: Past Medical History:  Diagnosis Date  . Dizziness   . Hypothyroid    x 18 years  . Stroke ([facility]) 11/29/14  . Tinnitus     PAST SURGICAL HISTORY: Past Surgical History:  Procedure Laterality Date  . CESAREAN SECTION  1994  . LASIK  2007    . 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 Osteoporosis Mother   . Dementia Father   . Stroke Father   . Heart attack Father   . Hypertension Sister   . Other Sister        pacemaker  . Coronary artery disease Sister     SOCIAL HISTORY:  Social History   Socioeconomic History  . Marital status: Married    Spouse name: Loraine Leriche  . Number of children: 3  . Years of education: 62  . Highest education level: Not on file  Occupational History    Comment: beautician  Tobacco Use  . Smoking status: Never Smoker  . Smokeless tobacco: Never Used  Vaping Use  . Vaping Use: Never used  Substance and Sexual Activity  . Alcohol use: No    Alcohol/week: 0.0 standard drinks  . Drug use: No  . Sexual activity: Not on file  Other Topics Concern  . Not on file  Social History Narrative   Lives at home with husband   Caffeine use- none   Social Determinants of Health   Financial Resource Strain:   . Difficulty of Paying Living Expenses: Not on file  Food Insecurity:   . Worried About Programme researcher, broadcasting/film/video in the Last Year: Not on file  . Ran Out of Food in the Last Year: Not on file  Transportation Needs:   . Lack of Transportation (Medical): Not on file  . Lack of Transportation (Non-Medical): Not on file  Physical Activity:   . Days of Exercise per Week: Not on file  . Minutes of Exercise per Session: Not on file  Stress:   . Feeling of Stress : Not on file  Social Connections:   . Frequency of Communication with Friends and Family: Not on file  . Frequency of Social Gatherings with Friends and Family: Not on file  . Attends Religious Services: Not on file  . Active Member of Clubs or Organizations: Not on file  . Attends Banker Meetings: Not on file  . Marital Status: Not on file  Intimate Partner Violence:   . Fear of Current or Ex-Partner: Not on file  . Emotionally Abused: Not on file  .  Physically Abused: Not on file  . Sexually Abused: Not on file     PHYSICAL EXAM  GENERAL EXAM/CONSTITUTIONAL: Vitals:  Vitals:   11/11/19 1352  BP: 130/87  Pulse: 77  Weight: 189 lb (85.7 kg)   Body mass index is 34.57 kg/m. No exam data present  Patient is in no distress; well developed, nourished and groomed; neck is supple  CARDIOVASCULAR:  Examination of carotid arteries is normal; no carotid bruits  Regular rate and rhythm, no murmurs  Examination of peripheral vascular system by observation and palpation is normal  EYES:  Ophthalmoscopic exam of optic discs and posterior segments  is normal; no papilledema or hemorrhages  MUSCULOSKELETAL:  Gait, strength, tone, movements noted in Neurologic exam below  NEUROLOGIC: MENTAL STATUS:  No flowsheet data found.  awake, alert, oriented to person, place and time  recent and remote memory intact  normal attention and concentration  language fluent, comprehension intact, naming intact,   fund of knowledge appropriate  CRANIAL NERVE:   2nd, 3rd, 4th, 6th - pupils equal and reactive to light, visual fields full to confrontation, extraocular muscles intact, no nystagmus  5th - facial sensation symmetric  7th - facial strength symmetric  8th - hearing intact  9th - palate elevates symmetrically, uvula midline  11th - shoulder shrug symmetric  12th - tongue protrusion midline  MOTOR:   normal bulk and tone, full strength in the BUE and BLE  SENSORY:   normal and symmetric to light touch, temperature, vibration  COORDINATION:   finger-nose-finger, fine finger movements normal  REFLEXES:   deep tendon reflexes present and symmetric  GAIT/STATION:   narrow based gait    DIAGNOSTIC DATA (LABS, IMAGING, TESTING) - I reviewed patient records, labs, notes, testing and imaging myself where available.  No results found for: WBC, HGB, HCT, MCV, PLT No results found for: NA, K, CL, CO2, GLUCOSE,  BUN, CREATININE, CALCIUM, PROT, ALBUMIN, AST, ALT, ALKPHOS, BILITOT, GFRNONAA, GFRAA No results found for: CHOL, HDL, LDLCALC, LDLDIRECT, TRIG, CHOLHDL No results found for: RWER1V No results found for: VITAMINB12 No results found for: TSH   12/07/14 MRI BRAIN [I reviewed images myself and agree with interpretation. ADC map signal is iso-intense, with T2 and T2FLAIR hyperintensity, consistent with -VRP]  - Acute LEFT posterior frontoparietal subcortical white matter infarct. No associated hemorrhage.   12/08/14 Carotid u/s  - Color duplex indicates minimal heterogeneous plaque, with no hemodynamically significant stenosis by duplex criteria in the extracranial cerebrovascular circulation.      ASSESSMENT AND PLAN  59 y.o. 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 Patient was not on antiplatelet therapy at the time of stroke. Now patient is on Plavix 75 mg daily. Has had good recovery. Stroke risk factors at this time include sleep apnea.   Dx:   Vertigo - Plan: MR BRAIN W WO CONTRAST  Other headache syndrome - Plan: MR BRAIN W WO CONTRAST   PLAN:  NEW ONSET VERTIGO / TINNITUS / HEADACHE  - check MRI brain (rule out stroke, inflamm, autoimmune)  STROKE PREVENTION - continue plavix 75mg  daily, statin, metoprolol - continue sleep apnea treatment plan - stroke warning signs and treatment reviewed  Orders Placed This Encounter  Procedures  . MR BRAIN W WO CONTRAST   Return for pending if symptoms worsen or fail to improve.    , MD 11/11/2019, 2:12 PM Certified in Neurology, Neurophysiology and Neuroimaging  [facility] [address] [place], [place] [phone]

## 2019-11-12 ENCOUNTER — Telehealth: Payer: Self-pay | Admitting: Diagnostic Neuroimaging

## 2019-11-12 NOTE — Telephone Encounter (Signed)
no to the covid questions MR Brain w/wo contrast Dr. Theresa Mulligan Auth: [id number]. Patient is scheduled at [facility] for 11/18/19.

## 2019-11-18 ENCOUNTER — Ambulatory Visit: Payer: BC Managed Care – PPO

## 2019-11-18 ENCOUNTER — Other Ambulatory Visit: Payer: Self-pay

## 2019-11-18 DIAGNOSIS — R42 Dizziness and giddiness: Secondary | ICD-10-CM | POA: Diagnosis not present

## 2019-11-18 DIAGNOSIS — G4489 Other headache syndrome: Secondary | ICD-10-CM | POA: Diagnosis not present

## 2019-11-18 MED ORDER — GADOBENATE DIMEGLUMINE 529 MG/ML IV SOLN
15.0000 mL | Freq: Once | INTRAVENOUS | Status: AC | PRN
  Administered 2019-11-18: 15 mL via INTRAVENOUS

## 2019-11-24 ENCOUNTER — Telehealth: Payer: Self-pay | Admitting: *Deleted

## 2019-11-24 NOTE — Telephone Encounter (Signed)
Spoke with patient and informed her MRI brain showed a small non-specific scar tissue in left frontal lobe. There are no acute or major findings and no changes related to her symptoms. Continue with stroke prevention, sleep apnea treatment. Patient verbalized understanding, appreciation.

## 2020-04-13 IMAGING — CR DG CHEST 1V PORT
1 series · 1 of 1 positions shown · non-contrast
Comparison: Chest x-ray from 02/07/2006

CLINICAL DATA: Shortness of breath, congestion.

EXAM:
PORTABLE CHEST 1 VIEW

[portable]
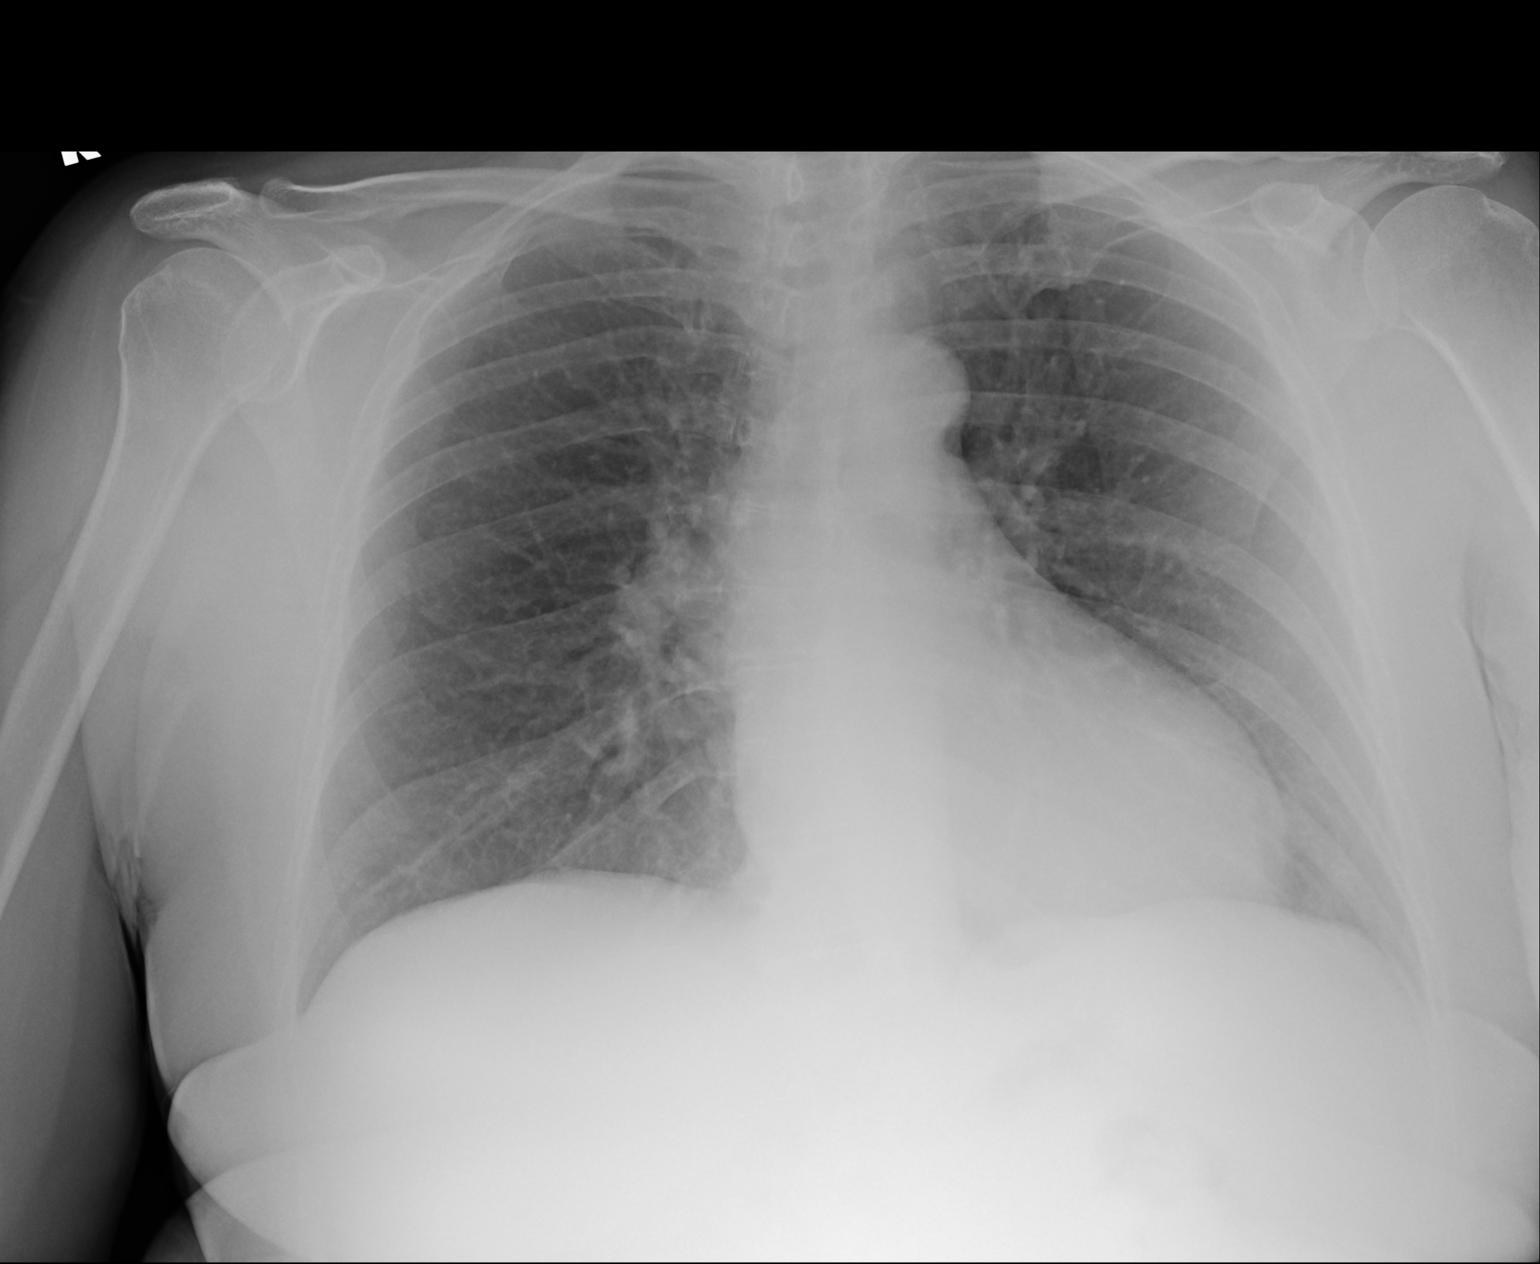

[1 of 1 positions shown; findings below may reference images not displayed]

FINDINGS: It has
IMPRESSION: No active disease.

## 2021-09-20 ENCOUNTER — Other Ambulatory Visit: Payer: Self-pay | Admitting: Nurse Practitioner

## 2021-09-20 DIAGNOSIS — Z1231 Encounter for screening mammogram for malignant neoplasm of breast: Secondary | ICD-10-CM

## 2021-10-25 ENCOUNTER — Ambulatory Visit
Admission: RE | Admit: 2021-10-25 | Discharge: 2021-10-25 | Disposition: A | Discharge status: Home / Self Care | Payer: BC Managed Care – PPO | Source: Ambulatory Visit | Attending: Nurse Practitioner | Admitting: Nurse Practitioner

## 2021-10-25 DIAGNOSIS — Z1231 Encounter for screening mammogram for malignant neoplasm of breast: Secondary | ICD-10-CM

## 2022-09-25 ENCOUNTER — Other Ambulatory Visit: Payer: Self-pay | Admitting: Nurse Practitioner

## 2022-09-25 DIAGNOSIS — Z1231 Encounter for screening mammogram for malignant neoplasm of breast: Secondary | ICD-10-CM

## 2022-11-09 ENCOUNTER — Ambulatory Visit
Admission: RE | Admit: 2022-11-09 | Discharge: 2022-11-09 | Disposition: A | Discharge status: Home / Self Care | Payer: 59 | Source: Ambulatory Visit | Attending: Nurse Practitioner | Admitting: Nurse Practitioner

## 2022-11-09 DIAGNOSIS — Z1231 Encounter for screening mammogram for malignant neoplasm of breast: Secondary | ICD-10-CM
